# Patient Record
Sex: Female | Born: 1935 | Race: White | Hispanic: No | Marital: Married | Smoking: Former smoker
Health system: Southern US, Community
[De-identification: ages and names within clinical notes are randomized; demographics above are authoritative.]

## PROBLEM LIST (undated history)

## (undated) HISTORY — PX: BREAST SURGERY: SHX581

## (undated) HISTORY — PX: TONSILLECTOMY: SUR1361

## (undated) HISTORY — PX: HERNIA REPAIR: SHX51

## (undated) HISTORY — PX: ABDOMINAL HYSTERECTOMY: SHX81

---

## 2014-11-05 ENCOUNTER — Emergency Department (HOSPITAL_BASED_OUTPATIENT_CLINIC_OR_DEPARTMENT_OTHER): Payer: Medicare Other

## 2014-11-05 ENCOUNTER — Encounter (HOSPITAL_BASED_OUTPATIENT_CLINIC_OR_DEPARTMENT_OTHER): Payer: Self-pay | Admitting: *Deleted

## 2014-11-05 ENCOUNTER — Emergency Department (HOSPITAL_BASED_OUTPATIENT_CLINIC_OR_DEPARTMENT_OTHER)
Admission: EM | Admit: 2014-11-05 | Discharge: 2014-11-05 | Disposition: A | Discharge status: Home / Self Care | Payer: Medicare Other | Attending: Emergency Medicine | Admitting: Emergency Medicine

## 2014-11-05 DIAGNOSIS — W268XXA Contact with other sharp object(s), not elsewhere classified, initial encounter: Secondary | ICD-10-CM | POA: Diagnosis not present

## 2014-11-05 DIAGNOSIS — Y9389 Activity, other specified: Secondary | ICD-10-CM | POA: Diagnosis not present

## 2014-11-05 DIAGNOSIS — S61012A Laceration without foreign body of left thumb without damage to nail, initial encounter: Secondary | ICD-10-CM | POA: Insufficient documentation

## 2014-11-05 DIAGNOSIS — Y9289 Other specified places as the place of occurrence of the external cause: Secondary | ICD-10-CM | POA: Diagnosis not present

## 2014-11-05 DIAGNOSIS — Z87891 Personal history of nicotine dependence: Secondary | ICD-10-CM | POA: Diagnosis not present

## 2014-11-05 DIAGNOSIS — S61412A Laceration without foreign body of left hand, initial encounter: Secondary | ICD-10-CM

## 2014-11-05 DIAGNOSIS — S6992XA Unspecified injury of left wrist, hand and finger(s), initial encounter: Secondary | ICD-10-CM | POA: Diagnosis present

## 2014-11-05 DIAGNOSIS — Y998 Other external cause status: Secondary | ICD-10-CM | POA: Diagnosis not present

## 2014-11-05 MED ORDER — LIDOCAINE HCL (PF) 1 % IJ SOLN
5.0000 mL | Freq: Once | INTRAMUSCULAR | Status: AC
  Administered 2014-11-05: 5 mL via INTRADERMAL
  Filled 2014-11-05: qty 5

## 2014-11-05 MED ORDER — CEPHALEXIN 500 MG PO CAPS: 500.0000 mg | ORAL_CAPSULE | Freq: Four times a day (QID) | ORAL | Status: AC

## 2014-11-05 NOTE — Discharge Instructions (Signed)
Take the prescribed medication as directed.  Monitor wound for any signs of infection including redness, swelling, drainage, fever, etc. Follow-up with your primary care physician in one week for suture removal.  Tried to keep sutures clean and dry until this time. Return to the ED for new or worsening symptoms.  Laceration Care, Adult A laceration is a cut that goes through all of the layers of the skin and into the tissue that is right under the skin. Some lacerations heal on their own. Others need to be closed with stitches (sutures), staples, skin adhesive strips, or skin glue. Proper laceration care minimizes the risk of infection and helps the laceration to heal better. HOW TO CARE FOR YOUR LACERATION If sutures or staples were used:  Keep the wound clean and dry.  If you were given a bandage (dressing), you should change it at least one time per day or as told by your health care provider. You should also change it if it becomes wet or dirty.  Keep the wound completely dry for the first 24 hours or as told by your health care provider. After that time, you may shower or bathe. However, make sure that the wound is not soaked in water until after the sutures or staples have been removed.  Clean the wound one time each day or as told by your health care provider:  Wash the wound with soap and water.  Rinse the wound with water to remove all soap.  Pat the wound dry with a clean towel. Do not rub the wound.  After cleaning the wound, apply a thin layer of antibiotic ointmentas told by your health care provider. This will help to prevent infection and keep the dressing from sticking to the wound.  Have the sutures or staples removed as told by your health care provider. If skin adhesive strips were used:  Keep the wound clean and dry.  If you were given a bandage (dressing), you should change it at least one time per day or as told by your health care provider. You should also change  it if it becomes dirty or wet.  Do not get the skin adhesive strips wet. You may shower or bathe, but be careful to keep the wound dry.  If the wound gets wet, pat it dry with a clean towel. Do not rub the wound.  Skin adhesive strips fall off on their own. You may trim the strips as the wound heals. Do not remove skin adhesive strips that are still stuck to the wound. They will fall off in time. If skin glue was used:  Try to keep the wound dry, but you may briefly wet it in the shower or bath. Do not soak the wound in water, such as by swimming.  After you have showered or bathed, gently pat the wound dry with a clean towel. Do not rub the wound.  Do not do any activities that will make you sweat heavily until the skin glue has fallen off on its own.  Do not apply liquid, cream, or ointment medicine to the wound while the skin glue is in place. Using those may loosen the film before the wound has healed.  If you were given a bandage (dressing), you should change it at least one time per day or as told by your health care provider. You should also change it if it becomes dirty or wet.  If a dressing is placed over the wound, be careful not to  apply tape directly over the skin glue. Doing that may cause the glue to be pulled off before the wound has healed.  Do not pick at the glue. The skin glue usually remains in place for 5-10 days, then it falls off of the skin. General Instructions  Take over-the-counter and prescription medicines only as told by your health care provider.  If you were prescribed an antibiotic medicine or ointment, take or apply it as told by your doctor. Do not stop using it even if your condition improves.  To help prevent scarring, make sure to cover your wound with sunscreen whenever you are outside after stitches are removed, after adhesive strips are removed, or when glue remains in place and the wound is healed. Make sure to wear a sunscreen of at least 30  SPF.  Do not scratch or pick at the wound.  Keep all follow-up visits as told by your health care provider. This is important.  Check your wound every day for signs of infection. Watch for:  Redness, swelling, or pain.  Fluid, blood, or pus.  Raise (elevate) the injured area above the level of your heart while you are sitting or lying down, if possible. SEEK MEDICAL CARE IF:  You received a tetanus shot and you have swelling, severe pain, redness, or bleeding at the injection site.  You have a fever.  A wound that was closed breaks open.  You notice a bad smell coming from your wound or your dressing.  You notice something coming out of the wound, such as wood or glass.  Your pain is not controlled with medicine.  You have increased redness, swelling, or pain at the site of your wound.  You have fluid, blood, or pus coming from your wound.  You notice a change in the color of your skin near your wound.  You need to change the dressing frequently due to fluid, blood, or pus draining from the wound.  You develop a new rash.  You develop numbness around the wound. SEEK IMMEDIATE MEDICAL CARE IF:  You develop severe swelling around the wound.  Your pain suddenly increases and is severe.  You develop painful lumps near the wound or on skin that is anywhere on your body.  You have a red streak going away from your wound.  The wound is on your hand or foot and you cannot properly move a finger or toe.  The wound is on your hand or foot and you notice that your fingers or toes look pale or bluish.   This information is not intended to replace advice given to you by your health care provider. Make sure you discuss any questions you have with your health care provider.   Document Released: 12/18/2004 Document Revised: 05/04/2014 Document Reviewed: 12/14/2013 Elsevier Interactive Patient Education [employer]
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
## 2019-02-27 ENCOUNTER — Ambulatory Visit: Payer: Medicare Other | Attending: Internal Medicine

## 2019-02-27 DIAGNOSIS — Z23 Encounter for immunization: Secondary | ICD-10-CM | POA: Insufficient documentation

## 2019-02-27 NOTE — Progress Notes (Signed)
   Covid-19 Vaccination Clinic  Name:  Paige Rivers    MRN: [id number] DOB: 1933-05-28  02/27/2019  [name] was observed post Covid-19 immunization for 15 minutes without incidence. She was provided with Vaccine Information Sheet and instruction to access the V-Safe system.   [name] was instructed to call 911 with any severe reactions post vaccine: Marland Kitchen Difficulty breathing  . Swelling of your face and throat  . A fast heartbeat  . A bad rash all over your body  . Dizziness and weakness    Immunizations Administered    Name Date Dose VIS Date Route   Pfizer COVID-19 Vaccine 02/27/2019  8:45 AM 0.3 mL 12/12/2018 Intramuscular   Manufacturer: [employer], [employer]   Lot: [id number]   NDC: [address]-0

## 2019-03-24 ENCOUNTER — Ambulatory Visit: Payer: Medicare Other | Attending: Internal Medicine

## 2019-03-24 DIAGNOSIS — Z23 Encounter for immunization: Secondary | ICD-10-CM

## 2019-03-24 NOTE — Progress Notes (Signed)
   Covid-19 Vaccination Clinic  Name:  [name]    MRN: [id number] DOB: June 30, 1933  03/24/2019  [name] was observed post Covid-19 immunization for 15 minutes without incident. She was provided with Vaccine Information Sheet and instruction to access the V-Safe system.   [name] was instructed to call 911 with any severe reactions post vaccine: Marland Kitchen Difficulty breathing  . Swelling of face and throat  . A fast heartbeat  . A bad rash all over body  . Dizziness and weakness   Immunizations Administered    Name Date Dose VIS Date Route   Pfizer COVID-19 Vaccine 03/24/2019 10:28 AM 0.3 mL 12/12/2018 Intramuscular   Manufacturer: [employer], [employer]   Lot: [id number]   NDC: [address]-0
